# Patient Record
Sex: Female | Born: 2005
Health system: Southern US, Community
[De-identification: ages and names within clinical notes are randomized; demographics above are authoritative.]

---
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
## 2023-02-11 ENCOUNTER — Other Ambulatory Visit: Payer: Self-pay

## 2023-02-11 ENCOUNTER — Emergency Department
Admission: EM | Admit: 2023-02-11 | Discharge: 2023-02-11 | Disposition: A | Discharge status: Home / Self Care | Payer: Medicaid Other | Attending: Student in an Organized Health Care Education/Training Program | Admitting: Student in an Organized Health Care Education/Training Program

## 2023-02-11 DIAGNOSIS — H6501 Acute serous otitis media, right ear: Secondary | ICD-10-CM | POA: Diagnosis not present

## 2023-02-11 DIAGNOSIS — H9201 Otalgia, right ear: Secondary | ICD-10-CM | POA: Diagnosis present

## 2023-02-11 NOTE — Discharge Instructions (Addendum)
Your eardrum is intact and without signs of eardrum rupture or infection. You have fluid behind the eardrum which will resolve in time. You may take OTC pseudoephedrine, allergy medicine (Claritin/Allegra/Zyrtec) for additional relief. Follow-up with [facility] for ongoing symptoms.

## 2023-02-11 NOTE — ED Triage Notes (Signed)
Pt sts that she is having muffled right ear pain. Pt sts that she is not able to hear out of her ear well.

## 2023-02-11 NOTE — ED Provider Notes (Signed)
[facility] Emergency Department Provider Note     Event Date/Time   First MD Initiated Contact with Patient 02/11/23 1400     (approximate)   History   Otalgia   HPI  Cynthia Morales is a 17 y.o. female presents to the ED for evaluation of right otalgia and decreased hearing.  Patient reports hearing is muffled on that side and she is unable to hear very well.  She denies any nausea, vomiting, vertigo, or tinnitus.  Patient also denies any fevers, chills, sweats, otorrhea.  Physical Exam   Triage Vital Signs: ED Triage Vitals  Enc Vitals Group     BP 02/11/23 1340 127/78     Pulse Rate 02/11/23 1340 96     Resp 02/11/23 1340 17     Temp 02/11/23 1340 98 F (36.7 C)     Temp src --      SpO2 02/11/23 1340 98 %     Weight 02/11/23 1341 155 lb (70.3 kg)     Height 02/11/23 1341 '5\' 3"'$  (1.6 m)     Head Circumference --      Peak Flow --      Pain Score 02/11/23 1345 0     Pain Loc --      Pain Edu? --      Excl. in [place]? --     Most recent vital signs: Vitals:   02/11/23 1340  BP: 127/78  Pulse: 96  Resp: 17  Temp: 98 F (36.7 C)  SpO2: 98%    General Awake, no distress. NAD HEENT NCAT. PERRL. EOMI. No rhinorrhea. Mucous membranes are moist. TMs intact bilaterally.  Ear canals are clear without maceration or cerumen impaction.  The right TM with a serous effusion appreciated. CV:  Good peripheral perfusion.  RESP:  Normal effort.  ABD:  No distention.     ED Results / Procedures / Treatments   Labs (all labs ordered are listed, but only abnormal results are displayed) Labs Reviewed - No data to display   EKG   RADIOLOGY   No results found.   PROCEDURES:  Critical Care performed: No  Procedures   MEDICATIONS ORDERED IN ED: Medications - No data to display   IMPRESSION / MDM / [place] / ED COURSE  I reviewed the triage vital signs and the nursing notes.                              Differential  diagnosis includes, but is not limited to, otalgia, AOM, otitis externa, TM rupture, eustachian tube dysfunction  Patient's presentation is most consistent with acute, uncomplicated illness.  Patient's diagnosis is consistent with serous otitis media. Patient will be discharged home with directions to take OTC decongestant and allergy medicine. Patient is to follow up with [facility] as needed or otherwise directed. Patient is given ED precautions to return to the ED for any worsening or new symptoms.     FINAL CLINICAL IMPRESSION(S) / ED DIAGNOSES   Final diagnoses:  Non-recurrent acute serous otitis media of right ear     Rx / DC Orders   ED Discharge Orders     None        Note:  This document was prepared using Dragon voice recognition software and may include unintentional dictation errors.    Melvenia Needles, PA-C 02/11/23 1459    Merlyn Lot, MD 02/11/23 1525

## 2024-08-24 ENCOUNTER — Other Ambulatory Visit: Payer: Self-pay

## 2024-08-24 ENCOUNTER — Emergency Department
Admission: EM | Admit: 2024-08-24 | Discharge: 2024-08-24 | Disposition: A | Discharge status: Home / Self Care | Attending: Emergency Medicine | Admitting: Emergency Medicine

## 2024-08-24 DIAGNOSIS — N751 Abscess of Bartholin's gland: Secondary | ICD-10-CM | POA: Diagnosis present

## 2024-08-24 MED ORDER — OXYCODONE-ACETAMINOPHEN 5-325 MG PO TABS: 1.0000 | ORAL_TABLET | Freq: Three times a day (TID) | ORAL | 0 refills | Status: AC | PRN

## 2024-08-24 MED ORDER — SULFAMETHOXAZOLE-TRIMETHOPRIM 800-160 MG PO TABS: 1.0000 | ORAL_TABLET | Freq: Two times a day (BID) | ORAL | 0 refills | Status: AC

## 2024-08-24 MED ORDER — OXYCODONE-ACETAMINOPHEN 5-325 MG PO TABS
1.0000 | ORAL_TABLET | Freq: Once | ORAL | Status: AC
  Administered 2024-08-24: 1 via ORAL
  Filled 2024-08-24: qty 1

## 2024-08-24 MED ORDER — IBUPROFEN 800 MG PO TABS: 800.0000 mg | ORAL_TABLET | Freq: Three times a day (TID) | ORAL | 0 refills | Status: AC | PRN

## 2024-08-24 MED ORDER — LIDOCAINE HCL (PF) 1 % IJ SOLN
5.0000 mL | Freq: Once | INTRAMUSCULAR | Status: AC
  Administered 2024-08-24: 5 mL
  Filled 2024-08-24: qty 5

## 2024-08-24 NOTE — Discharge Instructions (Addendum)
 You were seen in the emergency department for a suspected cyst to the Bartholin gland with superimposed abscess.  Please keep the area clean and dry.  You may change the dressing once daily at home or wait until follow-up in 24 to 48 hours with an OB/GYN, in the ER or with a primary care provider if you are able to get one established in the next few days.  I have listed attachments and resources to help you find a primary care provider.  Please pick up and take antibiotics and pain medication as prescribed.  Please try to use ibuprofen  before the Percocet.  Please do not drive, operate machinery, drink alcohol, climb ladders/get on heights or sign legal documents while taking the Percocet.

## 2024-08-24 NOTE — ED Triage Notes (Signed)
 Pt sts that she has a vaginal abscess. Pt sts that if she wipes or have a BM that she does have purulent drainage on the toilet paper.

## 2024-08-24 NOTE — ED Provider Notes (Signed)
 [facility] Provider Note    Event Date/Time   First MD Initiated Contact with Patient 08/24/24 1245     (approximate)   History   Abscess   HPI  Cynthia Morales is a 18 y.o. female  with no significant past medical history presents to the emergency department with vaginal  pain and puslike and bloody drainage x 4 days.  States she is having some drainage when she wipes or has a bowel movement.  Patient denies fever, chills, dysuria, increased urinary frequency, trouble defecating, abdominal pain, vomiting.  Patient has been doing sitz bath's at home with minimal relief.    Physical Exam   Triage Vital Signs: ED Triage Vitals  Encounter Vitals Group     BP 08/24/24 1226 (!) 135/90     Girls Systolic BP Percentile --      Girls Diastolic BP Percentile --      Boys Systolic BP Percentile --      Boys Diastolic BP Percentile --      Pulse Rate 08/24/24 1226 (!) 115     Resp 08/24/24 1226 17     Temp 08/24/24 1226 97.9 F (36.6 C)     Temp Source 08/24/24 1226 Oral     SpO2 08/24/24 1226 98 %     Weight 08/24/24 1227 160 lb (72.6 kg)     Height 08/24/24 1227 5' 4 (1.626 m)     Head Circumference --      Peak Flow --      Pain Score 08/24/24 1227 8     Pain Loc --      Pain Education --      Exclude from Growth Chart --     Most recent vital signs: Vitals:   08/24/24 1226 08/24/24 1521  BP: (!) 135/90 116/68  Pulse: (!) 115 81  Resp: 17 18  Temp: 97.9 F (36.6 C)   SpO2: 98% 100%    General: Awake, in no acute distress. Appears stated age. CV: Good peripheral perfusion.  Respiratory:Normal respiratory effort.  No respiratory distress.  GI: Soft, non-distended, non-tender.  Skin:Warm, dry.  GU: 2x2 cm fluctuance with surrounding induration 3x3 in the 8 o'clock position in posterior introitus, no surrounding erythema or drainage present. Chaperone RN present in the room at time of exam and procedure.  ED Results / Procedures /  Treatments   Labs (all labs ordered are listed, but only abnormal results are displayed) Labs Reviewed - No data to display   EKG     RADIOLOGY    PROCEDURES:  Critical Care performed: No   .Incision and Drainage  Date/Time: 08/24/2024 6:06 PM  Performed by: Sheron Salm, PA-C Authorized by: Sheron Salm, PA-C   Consent:    Consent obtained:  Verbal   Consent given by:  Patient   Risks, benefits, and alternatives were discussed: yes     Risks discussed:  Damage to other organs, bleeding, incomplete drainage, infection and pain Universal protocol:    Procedure explained and questions answered to patient or proxy's satisfaction: yes     Immediately prior to procedure, a time out was called: yes     Patient identity confirmed:  Verbally with patient Location:    Type:  Bartholin cyst (Bartholin cyst with overlying abscess)   Size:  3x3 cm cyst, 2x2 cm fluctuance   Location:  Anogenital   Anogenital location:  Bartholin's gland Pre-procedure details:    Skin preparation:  Povidone-iodine Sedation:  Sedation type:  None Anesthesia:    Anesthesia method:  Local infiltration   Local anesthetic:  Lidocaine  1% w/o epi Procedure type:    Complexity:  Simple Procedure details:    Ultrasound guidance: no     Incision types:  Stab incision   Incision depth:  Subcutaneous   Wound management:  Extensive cleaning   Drainage:  Purulent and bloody   Drainage amount:  Moderate   Wound treatment:  Wound left open   Packing materials:  None Post-procedure details:    Procedure completion:  Tolerated with difficulty Comments:     Attempted word catheter insertion, but patient reported increased pain when the catheter was inflated to 2-3 mL, therefore I decided to take it out.  Did place sterile dressing over the affected area instead.     MEDICATIONS ORDERED IN ED: Medications  lidocaine  (PF) (XYLOCAINE ) 1 % injection 5 mL (5 mLs Infiltration Given 08/24/24 1338)   oxyCODONE -acetaminophen  (PERCOCET/ROXICET) 5-325 MG per tablet 1 tablet (1 tablet Oral Given 08/24/24 1433)  lidocaine  (PF) (XYLOCAINE ) 1 % injection 5 mL (5 mLs Infiltration Given 08/24/24 1439)     IMPRESSION / MDM / ASSESSMENT AND PLAN / ED COURSE  I reviewed the triage vital signs and the nursing notes.                              Differential diagnosis includes, but is not limited to, Bartholin gland abscess, cyst, cellulitis  Patient's presentation is most consistent with acute, uncomplicated illness.  Patient is a 18 year old female presenting with Bartholin's gland cyst with superimposed abscess in 8 o'clock position with no surrounding cellulitis.  Did do a stab incision and drainage of this affected area; please see procedure note for full details above after anesthesia with 9 mL of lidocaine  1% w/o epi and Percocet administration.  Attempted Word catheter placement, but patient had significant pain with placement, therefore I decided to remove it and she felt immediately relieved.  Told her I would do a sterile dressing instead and that she could follow-up in 24 to 48 hours with the OB/GYN listed for referral or in the emergency department.  Patient's vital signs are within normal range at time of discharge, afebrile and nontoxic appearing.  Provided with prophylactic Bactrim , ibuprofen  and Percocet prescriptions for pain.  Did review the PDMP prior to prescribing Percocet.  Gave her resources to primary care to establish care.  The patient may return to the emergency department for any new, worsening, or concerning symptoms. Patient was given the opportunity to ask questions; all questions were answered. Emergency department return precautions were discussed with the patient.  Patient is in agreement to the treatment plan.  Patient is stable for discharge.   FINAL CLINICAL IMPRESSION(S) / ED DIAGNOSES   Final diagnoses:  Abscess of right Bartholin gland     Rx / DC Orders   ED  Discharge Orders          Ordered    sulfamethoxazole -trimethoprim  (BACTRIM  DS) 800-160 MG tablet  2 times daily        08/24/24 1503    oxyCODONE -acetaminophen  (PERCOCET) 5-325 MG tablet  Every 8 hours PRN        08/24/24 1503    ibuprofen  (ADVIL ) 800 MG tablet  Every 8 hours PRN        08/24/24 1503             Note:  This document  was prepared using Conservation officer, historic buildings and may include unintentional dictation errors.     Sheron Salm, PA-C 08/24/24 1815    Viviann Pastor, MD 08/25/24 1140

## 2024-08-26 ENCOUNTER — Other Ambulatory Visit: Payer: Self-pay

## 2024-08-26 ENCOUNTER — Emergency Department
Admission: EM | Admit: 2024-08-26 | Discharge: 2024-08-26 | Disposition: A | Discharge status: Home / Self Care | Attending: Emergency Medicine | Admitting: Emergency Medicine

## 2024-08-26 DIAGNOSIS — Z5189 Encounter for other specified aftercare: Secondary | ICD-10-CM

## 2024-08-26 DIAGNOSIS — Z4800 Encounter for change or removal of nonsurgical wound dressing: Secondary | ICD-10-CM | POA: Diagnosis present

## 2024-08-26 NOTE — Discharge Instructions (Signed)
 Have been diagnosed with Bartholin abscess drainage in process of healing.  Please keep taking the antibiotic until you complete the treatment.  You can wash the area after you urinate.  Please come back to ED or go to your PCP if you have new symptoms symptoms worsen.  Please call OB/GYN and make an appointment to establish care and being seen.

## 2024-08-26 NOTE — ED Triage Notes (Signed)
 Pt to ED for wound recheck to vaginal abscess. Denies discharge or fevers. States was told to come back to reacess

## 2024-08-26 NOTE — ED Provider Notes (Signed)
 [facility] Provider Note    Event Date/Time   First MD Initiated Contact with Patient 08/26/24 1555     (approximate)   History   Wound Check    HPI  Cynthia Morales is a 18 y.o. female    with a past medical history of Bartholin gland abscess,  who presents to the ED complaining of recheck. 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  She was advised to come back today for recheck.  Patient states that she did not change the dressing.  She is on day 3 of antibiotics.  Patient denies chills, fevers, discharge.      There are no active problems to display for this patient.    ROS: Patient currently denies any vision changes, tinnitus, difficulty speaking, facial droop, sore throat, chest pain, shortness of breath, abdominal pain, nausea/vomiting/diarrhea, dysuria, or weakness/numbness/paresthesias in any extremity   Physical Exam   Triage Vital Signs: ED Triage Vitals  Encounter Vitals Group     BP 08/26/24 1520 119/83     Girls Systolic BP Percentile --      Girls Diastolic BP Percentile --      Boys Systolic BP Percentile --      Boys Diastolic BP Percentile --      Pulse Rate 08/26/24 1520 99     Resp 08/26/24 1523 16     Temp 08/26/24 1520 99.5 F (37.5 C)     Temp Source 08/26/24 1520 Oral     SpO2 08/26/24 1520 100 %     Weight 08/26/24 1522 158 lb 11.7 oz (72 kg)     Height --      Head Circumference --      Peak Flow --      Pain Score 08/26/24 1522 0     Pain Loc --      Pain Education --      Exclude from Growth Chart --     Most recent vital signs: Vitals:   08/26/24 1520 08/26/24 1523  BP: 119/83   Pulse: 99   Resp:  16  Temp: 99.5 F (37.5 C)   SpO2: 100%      Physical Exam Vitals and nursing note reviewed.  During triage vital signs were normal  Constitutional:      General:  Awake and alert. No acute distress.    Appearance: Normal appearance. The patient is normal weight.      Able to speak in complete sentences without cough or dyspnea  HENT:     Head: Normocephalic and atraumatic.     Mouth: Mucous membranes are moist.  Eyes:     General: PERRL. Normal EOMs          Conjunctiva/sclera: Conjunctivae normal.  Nose No congestion/rhinorrhea  CV:                  Good peripheral perfusion.  Regular rate and rhythm  Resp:               Normal effort.  Equal breath sounds bilaterally.  Abd:                 No distention.  Soft, nontender.  No rebound or guarding.  Musculoskeletal:        General: No swelling. Normal range of motion.  GU: Presence of dressing with  tape on the right labia majora.  Removed the dressing, the skin is healing, no signs of infection, no drainage.  Skin:    General: Skin is warm and dry.     Capillary Refill: Capillary refill takes less than 2 seconds.     Findings: No rash.  Neurological:     Mental Status: The patient is awake and alert. MAE spontaneously. No gross focal neurologic deficits are appreciated.  Psychiatric Mood and affect are normal. Speech and behavior are normal.  ED Results / Procedures / Treatments   Labs (all labs ordered are listed, but only abnormal results are displayed) Labs Reviewed - No data to display    PROCEDURES:  Critical Care performed:   Procedures   MEDICATIONS ORDERED IN ED: Medications - No data to display    IMPRESSION / MDM / ASSESSMENT AND PLAN / ED COURSE  I reviewed the triage vital signs and the nursing notes.  Differential diagnosis includes, but is not limited to, open wound, Bartholin abscess, STD  Patient's presentation is most consistent with acute, uncomplicated illness.   Cynthia Morales is a 18 y.o., female who presents today with history of Bartholin abscess drainage on August 24, 2024.  Patient denies fever, chills.  He is taking the antibiotic as indicated.   She denies drainage.  She states she did not change the dressing.  During physical exam I did remove the dressing, it was malodorous, no presence of discharge, no signs of infection.  Rest of physical exam is normal Patient's diagnosis is consistent with open wound and healing process. I did not order any imaging or labs, physical exam is reassuring I did review the patient's allergies and medications.The patient is in stable and satisfactory condition for discharge home  Patient will be discharged home without prescriptions .  Advised patient to keep taking Bactrim  and complete the treatment to prevent resistance.  Patient is to follow up with OB/GYN as needed or otherwise directed. Patient is given ED precautions to return to the ED for any worsening or new symptoms.  Patient states [facility] has appointment for new patients in January of next year, I will provide more OB/GYN options so she can get an appointment. Discussed plan of care with patient, answered all of patient's questions, Patient agreeable to plan of care. Advised patient to take medications according to the instructions on the label. Discussed possible side effects of new medications. Patient verbalized understanding.  FINAL CLINICAL IMPRESSION(S) / ED DIAGNOSES   Final diagnoses:  Visit for wound check     Rx / DC Orders   ED Discharge Orders     None        Note:  This document was prepared using Dragon voice recognition software and may include unintentional dictation errors.   Janit Kast, PA-C 08/26/24 1623    Arlander [place], MD 08/26/24 [phone]
